# Patient Record
Sex: Female | Born: 1935 | Race: White | Hispanic: No | Marital: Married
Health system: Southern US, Community
[De-identification: ages and names within clinical notes are randomized; demographics above are authoritative.]

## PROBLEM LIST (undated history)

## (undated) DIAGNOSIS — E785 Hyperlipidemia, unspecified: Secondary | ICD-10-CM

## (undated) DIAGNOSIS — I10 Essential (primary) hypertension: Secondary | ICD-10-CM

## (undated) HISTORY — PX: BREAST BIOPSY: SHX20

## (undated) HISTORY — DX: Essential (primary) hypertension: I10

## (undated) HISTORY — DX: Hyperlipidemia, unspecified: E78.5

---

## 1998-10-17 ENCOUNTER — Other Ambulatory Visit: Admission: RE | Admit: 1998-10-17 | Discharge: 1998-10-17 | Payer: Self-pay | Admitting: *Deleted

## 1998-12-09 ENCOUNTER — Ambulatory Visit (HOSPITAL_BASED_OUTPATIENT_CLINIC_OR_DEPARTMENT_OTHER): Admission: RE | Admit: 1998-12-09 | Discharge: 1998-12-09 | Payer: Self-pay | Admitting: *Deleted

## 2000-11-28 ENCOUNTER — Other Ambulatory Visit: Admission: RE | Admit: 2000-11-28 | Discharge: 2000-11-28 | Payer: Self-pay | Admitting: *Deleted

## 2002-01-12 ENCOUNTER — Ambulatory Visit (HOSPITAL_COMMUNITY): Admission: RE | Admit: 2002-01-12 | Discharge: 2002-01-12 | Payer: Self-pay | Admitting: Gastroenterology

## 2003-08-01 ENCOUNTER — Emergency Department (HOSPITAL_COMMUNITY): Admission: EM | Admit: 2003-08-01 | Discharge: 2003-08-01 | Payer: Self-pay

## 2003-12-28 ENCOUNTER — Other Ambulatory Visit: Admission: RE | Admit: 2003-12-28 | Discharge: 2003-12-28 | Payer: Self-pay | Admitting: *Deleted

## 2006-07-24 ENCOUNTER — Other Ambulatory Visit: Admission: RE | Admit: 2006-07-24 | Discharge: 2006-07-24 | Payer: Self-pay | Admitting: *Deleted

## 2014-08-09 ENCOUNTER — Encounter: Payer: Medicare Other | Attending: Family Medicine | Admitting: Skilled Nursing Facility1

## 2014-08-09 ENCOUNTER — Encounter: Payer: Self-pay | Admitting: Skilled Nursing Facility1

## 2014-08-09 VITALS — Ht 65.0 in | Wt 178.0 lb

## 2014-08-09 DIAGNOSIS — Z713 Dietary counseling and surveillance: Secondary | ICD-10-CM | POA: Diagnosis not present

## 2014-08-09 DIAGNOSIS — E119 Type 2 diabetes mellitus without complications: Secondary | ICD-10-CM

## 2014-08-09 NOTE — Progress Notes (Signed)
Diabetes Self-Management Education  Visit Type:    Appt. Start Time: 10:00Appt. End Time: 11:30 08/09/2014  Ms. Kathryn Dominguez, identified by name and date of birth, is a 81 y.o. female with a diagnosis of Diabetes: Type 2.  Other people present during visit:  Family Member   ASSESSMENT  Height 5\' 5"  (1.651 m), weight 178 lb (80.74 kg). Body mass index is 29.62 kg/(m^2).   Initial Visit Information:  Are you currently following a meal plan?: No   Are you taking your medications as prescribed?: Not on Medications Are you checking your feet?: No        Psychosocial:     Patient Belief/Attitude about Diabetes: Denial Self-management support: Family Other persons present: Family Member Patient Concerns: Nutrition/Meal planning, Weight Control, Glycemic Control Preferred Learning Style: Auditory Learning Readiness: Not Ready  Complications:   Last HgB A1C per patient/outside source: 6.5 mg/dL How often do you check your blood sugar?: 0 times/day (not testing) Number of hypoglycemic episodes per month: 0 Number of hyperglycemic episodes per week: 0 Have you had a dilated eye exam in the past 12 months?: Yes Have you had a dental exam in the past 12 months?: Yes  Diet Intake:  Breakfast: oatmeal, toast Snack (morning): orange juice, cracker Lunch: half a sandwhich and crackers and pimento cheese Snack (afternoon): fruit Dinner: soup------pintos and mac n cheese cabbage and corn bread Snack (evening): fruit or crackers---popcorn Beverage(s): coffee, diet soda, water  Exercise:  Exercise: ADL's  Individualized Plan for Diabetes Self-Management Training:   Learning Objective:  Patient will have a greater understanding of diabetes self-management.  Patient education plan per assessed needs and concerns is to attend individual sessions for     Education Topics Reviewed with Patient Today:  Definition of diabetes, type 1 and 2, and the diagnosis of diabetes,  Factors that contribute to the development of diabetes Role of diet in the treatment of diabetes and the relationship between the three main macronutrients and blood glucose level, Food label reading, portion sizes and measuring food., Carbohydrate counting Role of exercise on diabetes management, blood pressure control and cardiac health., Helped patient identify appropriate exercises in relation to his/her diabetes, diabetes complications and other health issue.   Yearly dilated eye exam, Daily foot exams   Assessed and discussed foot care and prevention of foot problems        PATIENTS GOALS/Plan (Developed by the patient):  Physical Activity: Exercise 5-7 days per week  Plan:   There are no Patient Instructions on file for this visit.  Expected Outcomes:  Demonstrated interest in learning. Expect positive outcomes  Education material provided: Living Well with Diabetes, Meal plan card and Snack sheet  If problems or questions, patient to contact team via:  Phone  Future DSME appointment: PRN

## 2018-03-12 ENCOUNTER — Other Ambulatory Visit (HOSPITAL_BASED_OUTPATIENT_CLINIC_OR_DEPARTMENT_OTHER): Payer: Self-pay | Admitting: Family Medicine

## 2018-03-12 DIAGNOSIS — Z1231 Encounter for screening mammogram for malignant neoplasm of breast: Secondary | ICD-10-CM

## 2018-03-31 ENCOUNTER — Encounter (HOSPITAL_BASED_OUTPATIENT_CLINIC_OR_DEPARTMENT_OTHER): Payer: Self-pay | Admitting: Radiology

## 2018-03-31 ENCOUNTER — Ambulatory Visit (HOSPITAL_BASED_OUTPATIENT_CLINIC_OR_DEPARTMENT_OTHER)
Admission: RE | Admit: 2018-03-31 | Discharge: 2018-03-31 | Disposition: A | Discharge status: Home / Self Care | Payer: Medicare Other | Source: Ambulatory Visit | Attending: Family Medicine | Admitting: Family Medicine

## 2018-03-31 DIAGNOSIS — Z1231 Encounter for screening mammogram for malignant neoplasm of breast: Secondary | ICD-10-CM | POA: Insufficient documentation

## 2019-03-19 ENCOUNTER — Other Ambulatory Visit: Payer: Self-pay | Admitting: Family Medicine

## 2019-03-19 DIAGNOSIS — M8580 Other specified disorders of bone density and structure, unspecified site: Secondary | ICD-10-CM

## 2019-05-08 ENCOUNTER — Other Ambulatory Visit (HOSPITAL_BASED_OUTPATIENT_CLINIC_OR_DEPARTMENT_OTHER): Payer: Self-pay | Admitting: Family Medicine

## 2019-05-08 DIAGNOSIS — Z1231 Encounter for screening mammogram for malignant neoplasm of breast: Secondary | ICD-10-CM

## 2019-05-08 DIAGNOSIS — M8580 Other specified disorders of bone density and structure, unspecified site: Secondary | ICD-10-CM

## 2019-05-18 ENCOUNTER — Other Ambulatory Visit: Payer: Self-pay

## 2019-05-18 ENCOUNTER — Ambulatory Visit (HOSPITAL_BASED_OUTPATIENT_CLINIC_OR_DEPARTMENT_OTHER)
Admission: RE | Admit: 2019-05-18 | Discharge: 2019-05-18 | Disposition: A | Discharge status: Home / Self Care | Payer: Medicare Other | Source: Ambulatory Visit | Attending: Family Medicine | Admitting: Family Medicine

## 2019-05-18 ENCOUNTER — Encounter (HOSPITAL_BASED_OUTPATIENT_CLINIC_OR_DEPARTMENT_OTHER): Payer: Self-pay

## 2019-05-18 DIAGNOSIS — Z1231 Encounter for screening mammogram for malignant neoplasm of breast: Secondary | ICD-10-CM | POA: Insufficient documentation

## 2019-05-18 DIAGNOSIS — M85851 Other specified disorders of bone density and structure, right thigh: Secondary | ICD-10-CM | POA: Diagnosis not present

## 2019-05-18 DIAGNOSIS — M8580 Other specified disorders of bone density and structure, unspecified site: Secondary | ICD-10-CM | POA: Diagnosis present

## 2019-05-19 ENCOUNTER — Other Ambulatory Visit: Payer: Self-pay | Admitting: Family Medicine

## 2019-05-19 DIAGNOSIS — R928 Other abnormal and inconclusive findings on diagnostic imaging of breast: Secondary | ICD-10-CM

## 2019-06-23 ENCOUNTER — Other Ambulatory Visit: Payer: Self-pay

## 2019-06-23 ENCOUNTER — Other Ambulatory Visit: Payer: Self-pay | Admitting: Family Medicine

## 2019-06-23 ENCOUNTER — Ambulatory Visit
Admission: RE | Admit: 2019-06-23 | Discharge: 2019-06-23 | Disposition: A | Discharge status: Home / Self Care | Payer: Medicare Other | Source: Ambulatory Visit | Attending: Family Medicine | Admitting: Family Medicine

## 2019-06-23 DIAGNOSIS — R928 Other abnormal and inconclusive findings on diagnostic imaging of breast: Secondary | ICD-10-CM

## 2019-06-23 DIAGNOSIS — N6310 Unspecified lump in the right breast, unspecified quadrant: Secondary | ICD-10-CM

## 2019-07-01 ENCOUNTER — Ambulatory Visit
Admission: RE | Admit: 2019-07-01 | Discharge: 2019-07-01 | Disposition: A | Discharge status: Home / Self Care | Payer: Medicare Other | Source: Ambulatory Visit | Attending: Family Medicine | Admitting: Family Medicine

## 2019-07-01 ENCOUNTER — Other Ambulatory Visit: Payer: Self-pay

## 2019-07-01 DIAGNOSIS — N6310 Unspecified lump in the right breast, unspecified quadrant: Secondary | ICD-10-CM

## 2020-04-26 DIAGNOSIS — D0439 Carcinoma in situ of skin of other parts of face: Secondary | ICD-10-CM | POA: Diagnosis not present

## 2020-04-26 DIAGNOSIS — Z86008 Personal history of in-situ neoplasm of other site: Secondary | ICD-10-CM | POA: Diagnosis not present

## 2020-04-26 DIAGNOSIS — L570 Actinic keratosis: Secondary | ICD-10-CM | POA: Diagnosis not present

## 2020-04-26 DIAGNOSIS — D0462 Carcinoma in situ of skin of left upper limb, including shoulder: Secondary | ICD-10-CM | POA: Diagnosis not present

## 2020-04-26 DIAGNOSIS — D0461 Carcinoma in situ of skin of right upper limb, including shoulder: Secondary | ICD-10-CM | POA: Diagnosis not present

## 2020-05-10 DIAGNOSIS — E782 Mixed hyperlipidemia: Secondary | ICD-10-CM | POA: Diagnosis not present

## 2020-05-10 DIAGNOSIS — E1140 Type 2 diabetes mellitus with diabetic neuropathy, unspecified: Secondary | ICD-10-CM | POA: Diagnosis not present

## 2020-05-10 DIAGNOSIS — E1169 Type 2 diabetes mellitus with other specified complication: Secondary | ICD-10-CM | POA: Diagnosis not present

## 2020-05-10 DIAGNOSIS — I10 Essential (primary) hypertension: Secondary | ICD-10-CM | POA: Diagnosis not present

## 2020-05-10 DIAGNOSIS — M8580 Other specified disorders of bone density and structure, unspecified site: Secondary | ICD-10-CM | POA: Diagnosis not present

## 2020-05-19 DIAGNOSIS — E1169 Type 2 diabetes mellitus with other specified complication: Secondary | ICD-10-CM | POA: Diagnosis not present

## 2020-05-19 DIAGNOSIS — Z0000 Encounter for general adult medical examination without abnormal findings: Secondary | ICD-10-CM | POA: Diagnosis not present

## 2020-05-19 DIAGNOSIS — I10 Essential (primary) hypertension: Secondary | ICD-10-CM | POA: Diagnosis not present

## 2020-05-26 DIAGNOSIS — E1169 Type 2 diabetes mellitus with other specified complication: Secondary | ICD-10-CM | POA: Diagnosis not present

## 2020-05-26 DIAGNOSIS — R609 Edema, unspecified: Secondary | ICD-10-CM | POA: Diagnosis not present

## 2020-05-26 DIAGNOSIS — Z0000 Encounter for general adult medical examination without abnormal findings: Secondary | ICD-10-CM | POA: Diagnosis not present

## 2020-05-26 DIAGNOSIS — I10 Essential (primary) hypertension: Secondary | ICD-10-CM | POA: Diagnosis not present

## 2020-05-26 DIAGNOSIS — E782 Mixed hyperlipidemia: Secondary | ICD-10-CM | POA: Diagnosis not present

## 2020-05-26 DIAGNOSIS — E1140 Type 2 diabetes mellitus with diabetic neuropathy, unspecified: Secondary | ICD-10-CM | POA: Diagnosis not present

## 2020-06-03 DIAGNOSIS — Z1231 Encounter for screening mammogram for malignant neoplasm of breast: Secondary | ICD-10-CM | POA: Diagnosis not present

## 2020-06-03 DIAGNOSIS — Z171 Estrogen receptor negative status [ER-]: Secondary | ICD-10-CM | POA: Diagnosis not present

## 2020-06-03 DIAGNOSIS — C50411 Malignant neoplasm of upper-outer quadrant of right female breast: Secondary | ICD-10-CM | POA: Diagnosis not present

## 2020-06-13 DIAGNOSIS — Z961 Presence of intraocular lens: Secondary | ICD-10-CM | POA: Diagnosis not present

## 2020-06-13 DIAGNOSIS — E113291 Type 2 diabetes mellitus with mild nonproliferative diabetic retinopathy without macular edema, right eye: Secondary | ICD-10-CM | POA: Diagnosis not present

## 2020-06-13 DIAGNOSIS — H524 Presbyopia: Secondary | ICD-10-CM | POA: Diagnosis not present

## 2020-06-27 DIAGNOSIS — I739 Peripheral vascular disease, unspecified: Secondary | ICD-10-CM | POA: Diagnosis not present

## 2020-06-27 DIAGNOSIS — B351 Tinea unguium: Secondary | ICD-10-CM | POA: Diagnosis not present

## 2020-06-27 DIAGNOSIS — L851 Acquired keratosis [keratoderma] palmaris et plantaris: Secondary | ICD-10-CM | POA: Diagnosis not present

## 2020-12-01 ENCOUNTER — Other Ambulatory Visit: Payer: Self-pay

## 2020-12-01 ENCOUNTER — Emergency Department (HOSPITAL_BASED_OUTPATIENT_CLINIC_OR_DEPARTMENT_OTHER)
Admission: EM | Admit: 2020-12-01 | Discharge: 2020-12-01 | Disposition: A | Discharge status: Home / Self Care | Payer: Medicare Other | Attending: Emergency Medicine | Admitting: Emergency Medicine

## 2020-12-01 ENCOUNTER — Emergency Department (HOSPITAL_BASED_OUTPATIENT_CLINIC_OR_DEPARTMENT_OTHER): Payer: Medicare Other

## 2020-12-01 ENCOUNTER — Encounter (HOSPITAL_BASED_OUTPATIENT_CLINIC_OR_DEPARTMENT_OTHER): Payer: Self-pay

## 2020-12-01 DIAGNOSIS — Y92002 Bathroom of unspecified non-institutional (private) residence single-family (private) house as the place of occurrence of the external cause: Secondary | ICD-10-CM | POA: Diagnosis not present

## 2020-12-01 DIAGNOSIS — S46912A Strain of unspecified muscle, fascia and tendon at shoulder and upper arm level, left arm, initial encounter: Secondary | ICD-10-CM | POA: Insufficient documentation

## 2020-12-01 DIAGNOSIS — Z79899 Other long term (current) drug therapy: Secondary | ICD-10-CM | POA: Diagnosis not present

## 2020-12-01 DIAGNOSIS — I10 Essential (primary) hypertension: Secondary | ICD-10-CM | POA: Diagnosis not present

## 2020-12-01 DIAGNOSIS — S8001XA Contusion of right knee, initial encounter: Secondary | ICD-10-CM | POA: Diagnosis not present

## 2020-12-01 DIAGNOSIS — S4992XA Unspecified injury of left shoulder and upper arm, initial encounter: Secondary | ICD-10-CM | POA: Diagnosis present

## 2020-12-01 DIAGNOSIS — W010XXA Fall on same level from slipping, tripping and stumbling without subsequent striking against object, initial encounter: Secondary | ICD-10-CM | POA: Diagnosis not present

## 2020-12-01 DIAGNOSIS — Y9301 Activity, walking, marching and hiking: Secondary | ICD-10-CM | POA: Insufficient documentation

## 2020-12-01 DIAGNOSIS — S53402A Unspecified sprain of left elbow, initial encounter: Secondary | ICD-10-CM | POA: Diagnosis not present

## 2020-12-01 DIAGNOSIS — S8002XA Contusion of left knee, initial encounter: Secondary | ICD-10-CM | POA: Diagnosis not present

## 2020-12-01 DIAGNOSIS — W19XXXA Unspecified fall, initial encounter: Secondary | ICD-10-CM

## 2020-12-01 MED ORDER — ACETAMINOPHEN 325 MG PO TABS
650.0000 mg | ORAL_TABLET | Freq: Once | ORAL | Status: AC
  Administered 2020-12-01: 650 mg via ORAL
  Filled 2020-12-01: qty 2

## 2020-12-01 NOTE — ED Provider Notes (Signed)
[place] EMERGENCY DEPARTMENT Provider Note   CSN: [id number] Arrival date & time: 12/01/20  0836     History Chief Complaint  Patient presents with   Kathryn Dominguez is an 87 y.o. female who presents s/p fall at home.  Patient was rushing to the bathroom this morning at 2am when she tripped on her own feet and fell. Denies syncope or LOC. No preceding dizziness/lightheadedness. She was able to scoot herself to her room and call her daughter. Has ambulated since the incident.  Thinks she hit her head against the refrigerator slightly. Denies headache. Not on any blood thinners.  Currently complains of left shoulder, left elbow, and bilateral knee pain. No headache, nausea, vomiting, back pain, neck pain.   Past Medical History:  Diagnosis Date   Hyperlipidemia    Hypertension     There are no problems to display for this patient.   Past Surgical History:  Procedure Laterality Date   BREAST BIOPSY       OB History   No obstetric history on file.     Family History  Problem Relation Age of Onset   Stroke Other        Home Medications Prior to Admission medications   Medication Sig Start Date End Date Taking? Authorizing Provider  calcium carbonate (OS-CAL) 600 MG TABS tablet Take 600 mg by mouth 2 (two) times daily with a meal.    [provider]  cholecalciferol (VITAMIN D) 1000 UNITS tablet Take 2,000 Units by mouth daily.    [provider]  metoprolol succinate (TOPROL-XL) 100 MG 24 hr tablet Take 100 mg by mouth daily. Take with or immediately following a meal.    [provider]  Omega-3 Fatty Acids (FISH OIL) 1200 MG CAPS Take 1,290 mg by mouth.    [provider]  Red Yeast Rice 600 MG CAPS Take by mouth.    [provider]  triamterene-hydrochlorothiazide (DYAZIDE) 37.5-25 MG per capsule Take 1 capsule by mouth daily.    [provider]    Allergies    Lovastatin  Review of  Systems   Review of Systems  Gastrointestinal:  Negative for vomiting.  Musculoskeletal:  Negative for back pain, gait problem and neck pain.       L shoulder pain, L elbow pain, bilateral knee pain  Neurological:  Negative for syncope, light-headedness and headaches.   Physical Exam Updated Vital Signs BP 129/70   Pulse (!) 56   Temp 97.7 F (36.5 C) (Oral)   Resp 18   Ht '5\' 5"'$  (1.651 m)   Wt 75.3 kg   SpO2 98%   BMI 27.62 kg/m   Physical Exam Constitutional:      General: She is not in acute distress.    Appearance: Normal appearance.  HENT:     Head: Normocephalic and atraumatic.  Abdominal:     Palpations: Abdomen is soft.     Tenderness: There is no abdominal tenderness.  Musculoskeletal:     Cervical back: Normal range of motion. No tenderness.     Comments: Clavicles nontender. No deformities noted. L shoulder tender to palpation over [employer] joint, pain with active ROM. L elbow tender to palpation with mild bruising noted, full ROM. 5/5 grip strength. NVI distally. Bilateral knees mildly tender to palpation of patella, slight bruising noted, full ROM, able to bear weight  Neurological:     General: No focal deficit present.     Mental  Status: She is alert.     Motor: No weakness.    ED Results / Procedures / Treatments   Labs (all labs ordered are listed, but only abnormal results are displayed) Labs Reviewed - No data to display  EKG None  Radiology DG Elbow Complete Left  Result Date: 12/01/2020 CLINICAL DATA:  Fall. EXAM: LEFT ELBOW - COMPLETE 3+ VIEW COMPARISON:  None. FINDINGS: No evidence of fracture of the ulna or humerus. The radial head is normal. No joint effusion. IMPRESSION: No fracture or dislocation. Electronically Signed   By: Suzy Bouchard M.D.   On: 12/01/2020 10:40   DG Shoulder Left  Result Date: 12/01/2020 CLINICAL DATA:  Fall EXAM: LEFT SHOULDER - 2+ VIEW COMPARISON:  None. FINDINGS: There is no acute fracture or dislocation. Shoulder  alignment is maintained. There is mild glenohumeral joint space narrowing with associated subchondral sclerosis and osteophytosis. The soft tissues are unremarkable. IMPRESSION: No acute fracture or dislocation. Mild degenerative changes as above. Electronically Signed   By: Valetta Mole M.D.   On: 12/01/2020 10:36   DG Knee Complete 4 Views Left  Result Date: 12/01/2020 CLINICAL DATA:  Left knee pain after fall today. EXAM: LEFT KNEE - COMPLETE 4+ VIEW COMPARISON:  None. FINDINGS: No evidence of fracture, dislocation, or joint effusion. No evidence of arthropathy or other focal bone abnormality. Soft tissues are unremarkable. IMPRESSION: Negative. Electronically Signed   By: Marijo Conception M.D.   On: 12/01/2020 10:37   DG Knee Complete 4 Views Right  Result Date: 12/01/2020 CLINICAL DATA:  Right knee pain after fall today. EXAM: RIGHT KNEE - COMPLETE 4+ VIEW COMPARISON:  None. FINDINGS: No evidence of fracture, dislocation, or joint effusion. No evidence of arthropathy or other focal bone abnormality. Soft tissues are unremarkable. IMPRESSION: Negative. Electronically Signed   By: Marijo Conception M.D.   On: 12/01/2020 10:39    Procedures Procedures   Medications Ordered in ED Medications  acetaminophen (TYLENOL) tablet 650 mg (650 mg Oral Given 12/01/20 0959)    ED Course  I have reviewed the triage vital signs and the nursing notes.  Pertinent labs & imaging results that were available during my care of the patient were reviewed by me and considered in my medical decision making (see chart for details).    MDM Rules/Calculators/A&P                         This is an 86 year old female presenting after mechanical fall at home. No significant head injury, patient is not on blood thinners, neuro exam is normal so no indication for head imaging at this time.  She complains of L shoulder, L elbow and bilateral knee pain. No deformities noted on exam but she has tenderness to palpation over  these joints and some pain with ROM. Will obtain plain films and give Tylenol for pain management.  X-rays of shoulder, elbow, and knees negative for fracture or other abnormality. Patient informed of findings. Advised to take Tylenol as needed for pain.  Stable for discharge home. Return precautions discussed. Final Clinical Impression(s) / ED Diagnoses Final diagnoses:  Fall, initial encounter  Strain of left shoulder, initial encounter  Strain of left elbow, initial encounter    Rx / DC Orders ED Discharge Orders     None      Alcus Dad, MD PGY-2 [facility]   Alcus Dad, MD 12/01/20 1124    Lucrezia Starch, MD 12/01/20  1324  

## 2020-12-01 NOTE — Discharge Instructions (Addendum)
Take Tylenol or Motrin for pain.  Come back to ER if you develop worsening pain, difficulty walking further falls or other new concerning symptoms.

## 2020-12-01 NOTE — ED Triage Notes (Signed)
Pt got up this morning around 0200 to go to bathroom and was walking to fast and her "feet got out from under me". Pt fell into the fridge and then fell to the floor. Pt c/o left shoulder, elbow and bilateral knee pain. Denies any LOC, denies hitting her head.

## 2021-02-20 IMAGING — MG MM DIGITAL DIAGNOSTIC UNILAT*R* W/ TOMO W/ CAD
7 series · 9 of 15 positions shown · non-contrast
Comparison: Previous exam(s).

CLINICAL DATA: Patient was called back from screening mammography
due to a right breast mass.

EXAM:
DIGITAL DIAGNOSTIC RIGHT MAMMOGRAM WITH TOMO
ULTRASOUND RIGHT BREAST

[R ML]
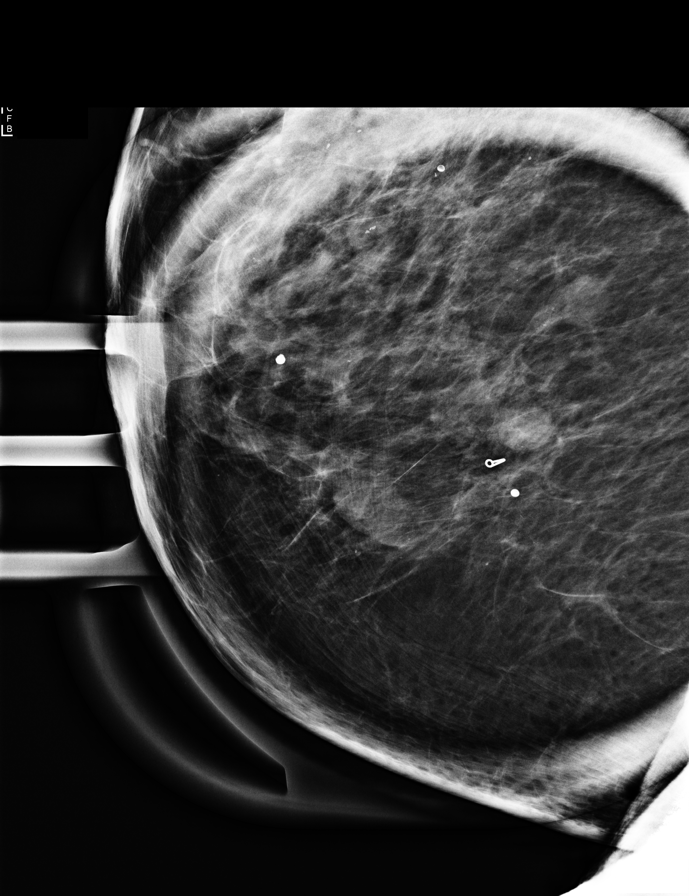

[R CC (1 of 2)]
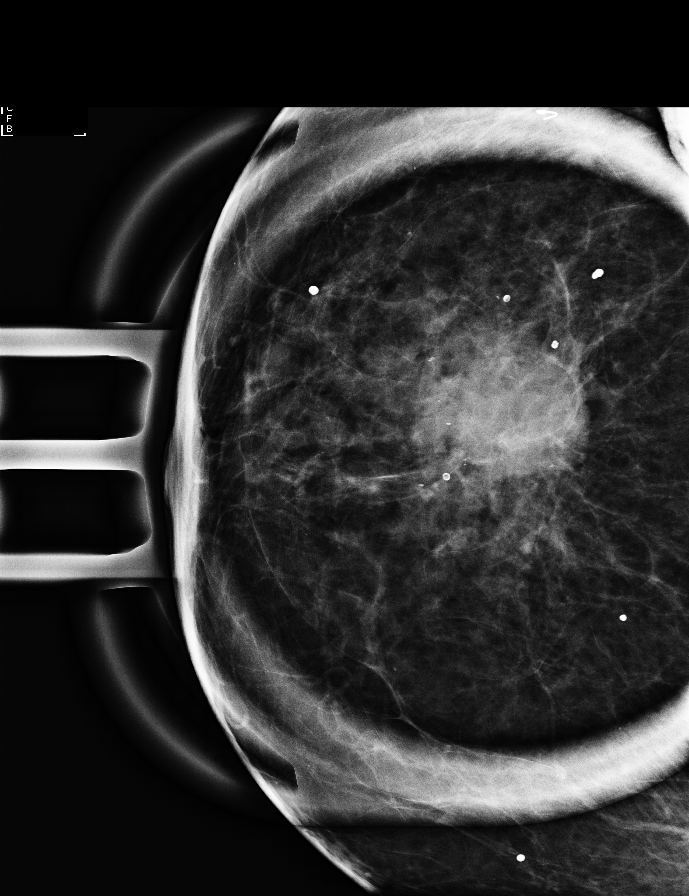

[R CC (2 of 2)]
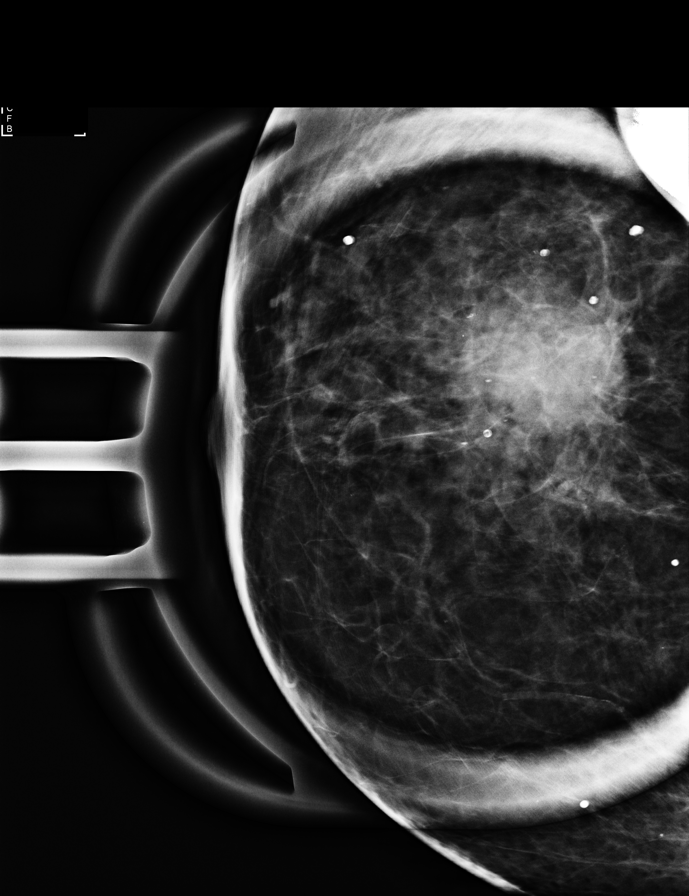

[R MLO synth-2D]
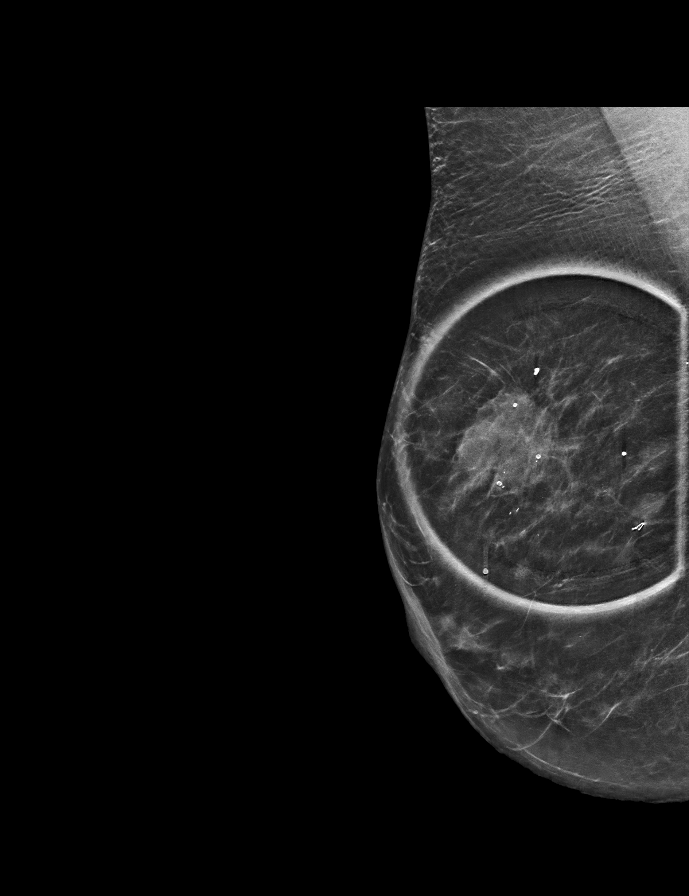

[R CC synth-2D]
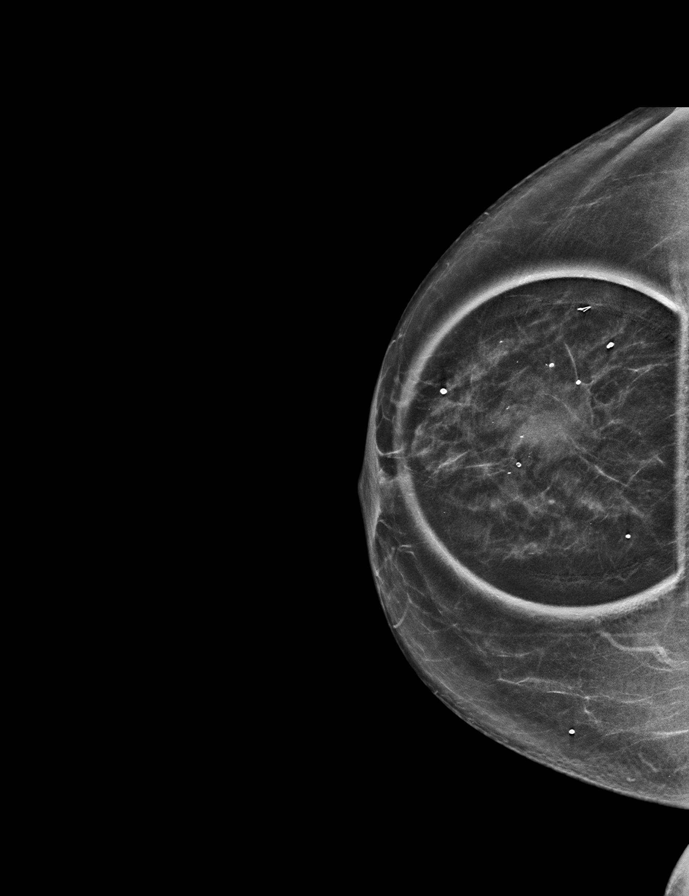

[R MLO tomo · 3 of 59 frames shown]
[frame 20/59]
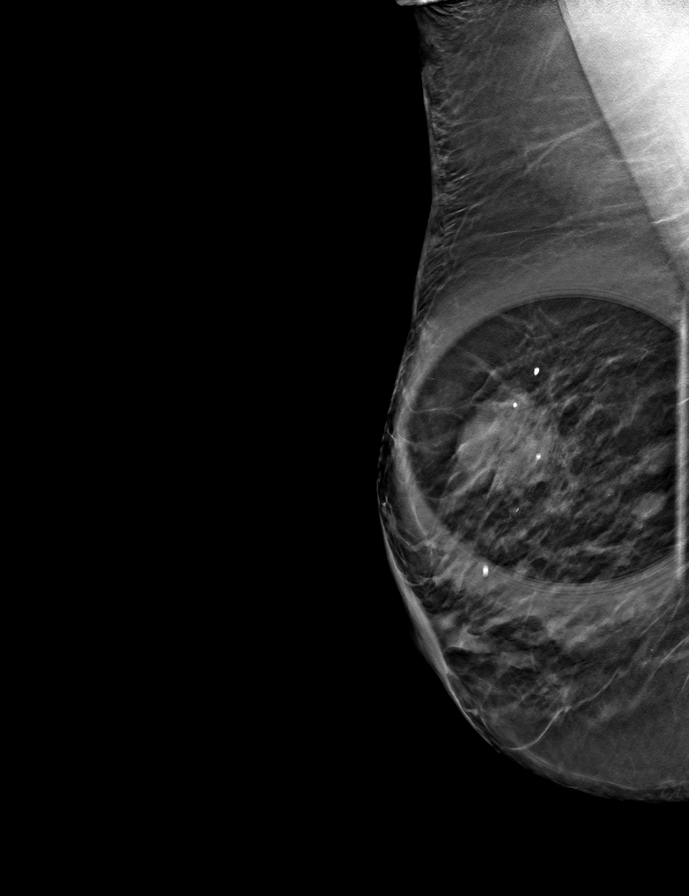
[frame 30/59]
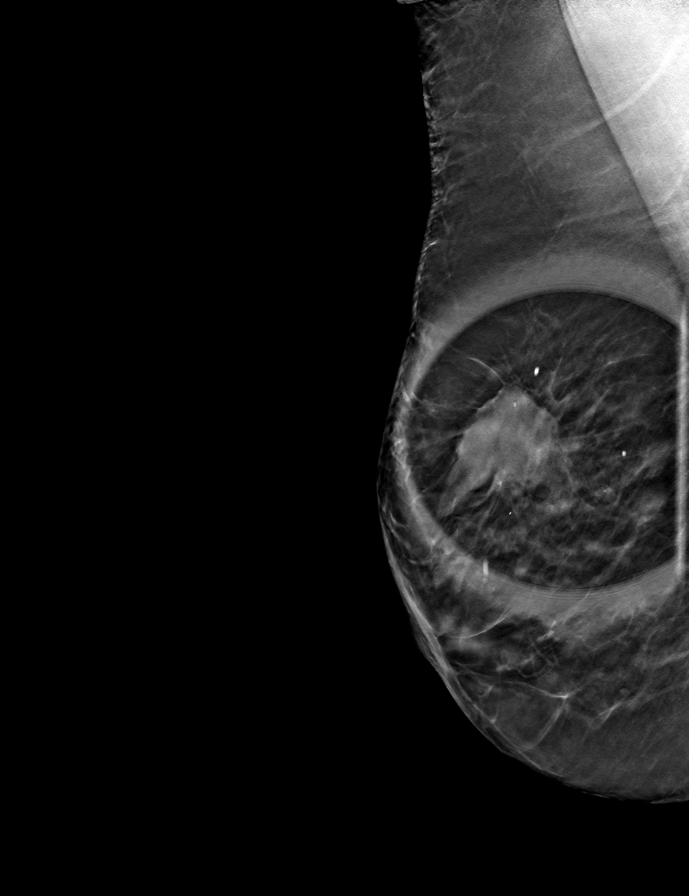
[frame 40/59]
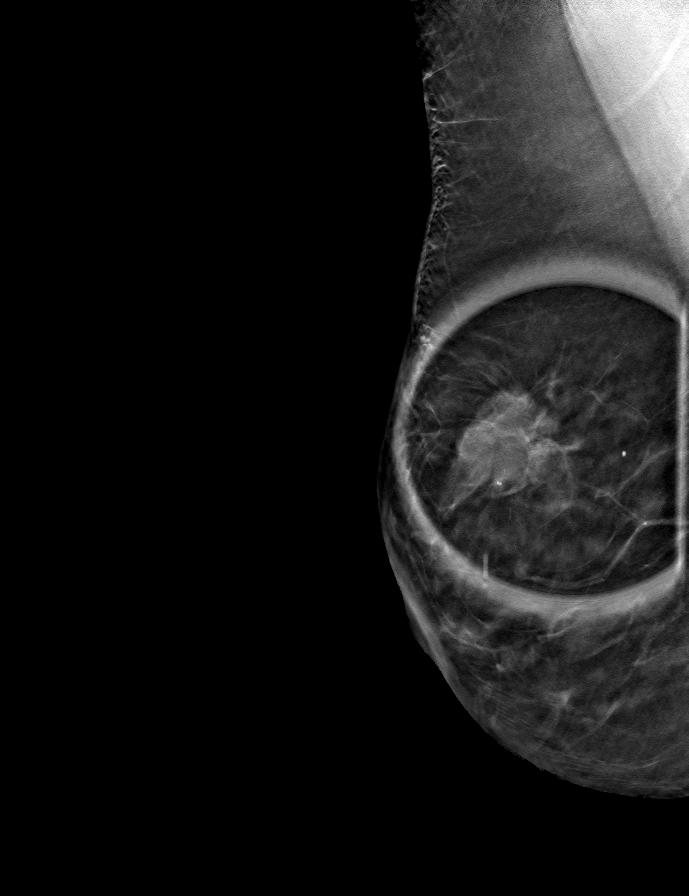

[R CC tomo · tomo slice 29/56.0]
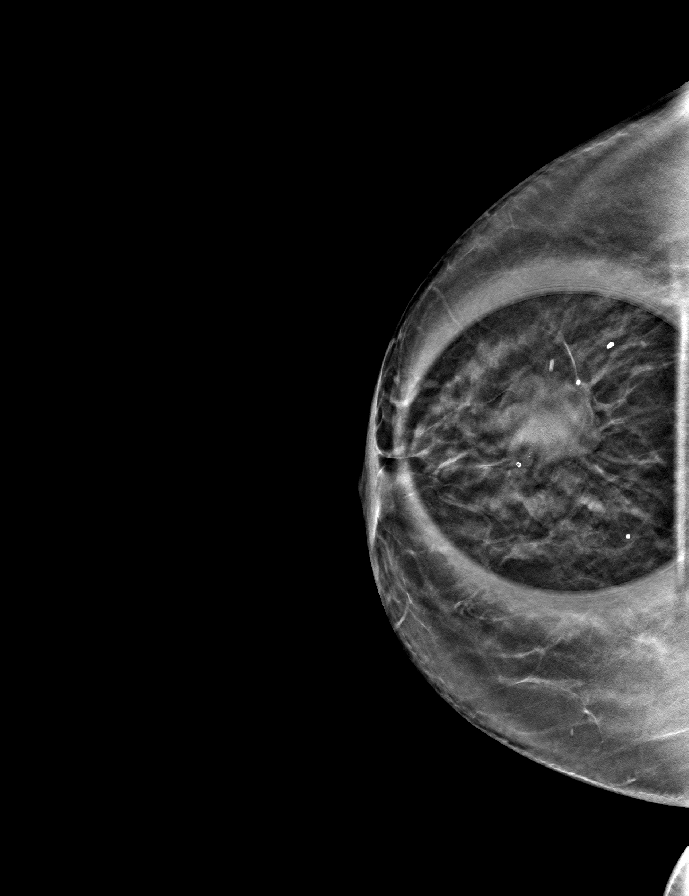

[9 of 15 positions shown; findings below may reference images not displayed]

ACR Breast Density Category b: There are scattered areas of
fibroglandular density.
FINDINGS: There is a mass at [DATE] in the right breast containing few
calcifications. No other suspicious findings.

On physical exam, there is a palpable lump in the right breast
correlating with the mammographically identified mass.

Targeted ultrasound is performed, showing a mass in the right breast
at [DATE], 4 cm from the nipple containing several calcifications
measuring 2.8 x 2.3 by 2.8 cm. The mass is irregular with internal
blood flow. No axillary adenopathy.
IMPRESSION: Highly suspicious right breast mass.  No other suspicious findings.

RECOMMENDATION:
Recommend ultrasound-guided biopsy of the right breast mass at
[DATE], 4 cm from the nipple.

I have discussed the findings and recommendations with the patient.
If applicable, a reminder letter will be sent to the patient
regarding the next appointment.

BI-RADS CATEGORY  5: Highly suggestive of malignancy.

## 2021-12-07 DIAGNOSIS — L851 Acquired keratosis [keratoderma] palmaris et plantaris: Secondary | ICD-10-CM | POA: Diagnosis not present

## 2021-12-07 DIAGNOSIS — I739 Peripheral vascular disease, unspecified: Secondary | ICD-10-CM | POA: Diagnosis not present

## 2021-12-07 DIAGNOSIS — B351 Tinea unguium: Secondary | ICD-10-CM | POA: Diagnosis not present

## 2021-12-13 DIAGNOSIS — L03116 Cellulitis of left lower limb: Secondary | ICD-10-CM | POA: Diagnosis not present

## 2021-12-26 DIAGNOSIS — E1169 Type 2 diabetes mellitus with other specified complication: Secondary | ICD-10-CM | POA: Diagnosis not present

## 2021-12-26 DIAGNOSIS — I10 Essential (primary) hypertension: Secondary | ICD-10-CM | POA: Diagnosis not present

## 2021-12-26 DIAGNOSIS — E782 Mixed hyperlipidemia: Secondary | ICD-10-CM | POA: Diagnosis not present

## 2022-01-01 DIAGNOSIS — R2242 Localized swelling, mass and lump, left lower limb: Secondary | ICD-10-CM | POA: Diagnosis not present

## 2022-01-01 DIAGNOSIS — Z23 Encounter for immunization: Secondary | ICD-10-CM | POA: Diagnosis not present

## 2022-01-02 DIAGNOSIS — R2242 Localized swelling, mass and lump, left lower limb: Secondary | ICD-10-CM | POA: Diagnosis not present

## 2022-01-03 ENCOUNTER — Other Ambulatory Visit (HOSPITAL_BASED_OUTPATIENT_CLINIC_OR_DEPARTMENT_OTHER): Payer: Self-pay | Admitting: Physician Assistant

## 2022-01-04 ENCOUNTER — Other Ambulatory Visit (HOSPITAL_BASED_OUTPATIENT_CLINIC_OR_DEPARTMENT_OTHER): Payer: Self-pay | Admitting: Physician Assistant

## 2022-01-04 DIAGNOSIS — R2242 Localized swelling, mass and lump, left lower limb: Secondary | ICD-10-CM

## 2022-01-30 DIAGNOSIS — E1169 Type 2 diabetes mellitus with other specified complication: Secondary | ICD-10-CM | POA: Diagnosis not present

## 2022-01-30 DIAGNOSIS — I10 Essential (primary) hypertension: Secondary | ICD-10-CM | POA: Diagnosis not present

## 2022-01-30 DIAGNOSIS — E782 Mixed hyperlipidemia: Secondary | ICD-10-CM | POA: Diagnosis not present

## 2022-02-08 DIAGNOSIS — C50411 Malignant neoplasm of upper-outer quadrant of right female breast: Secondary | ICD-10-CM | POA: Diagnosis not present

## 2022-02-08 DIAGNOSIS — Z888 Allergy status to other drugs, medicaments and biological substances status: Secondary | ICD-10-CM | POA: Diagnosis not present

## 2022-02-08 DIAGNOSIS — Z853 Personal history of malignant neoplasm of breast: Secondary | ICD-10-CM | POA: Diagnosis not present

## 2022-02-08 DIAGNOSIS — Z08 Encounter for follow-up examination after completed treatment for malignant neoplasm: Secondary | ICD-10-CM | POA: Diagnosis not present

## 2022-02-08 DIAGNOSIS — Z882 Allergy status to sulfonamides status: Secondary | ICD-10-CM | POA: Diagnosis not present

## 2022-02-08 DIAGNOSIS — Z171 Estrogen receptor negative status [ER-]: Secondary | ICD-10-CM | POA: Diagnosis not present

## 2022-03-06 DIAGNOSIS — D0461 Carcinoma in situ of skin of right upper limb, including shoulder: Secondary | ICD-10-CM | POA: Diagnosis not present

## 2022-03-06 DIAGNOSIS — D044 Carcinoma in situ of skin of scalp and neck: Secondary | ICD-10-CM | POA: Diagnosis not present

## 2022-03-06 DIAGNOSIS — D485 Neoplasm of uncertain behavior of skin: Secondary | ICD-10-CM | POA: Diagnosis not present

## 2022-03-06 DIAGNOSIS — D0472 Carcinoma in situ of skin of left lower limb, including hip: Secondary | ICD-10-CM | POA: Diagnosis not present

## 2022-03-06 DIAGNOSIS — Z859 Personal history of malignant neoplasm, unspecified: Secondary | ICD-10-CM | POA: Diagnosis not present

## 2022-03-06 DIAGNOSIS — Z85828 Personal history of other malignant neoplasm of skin: Secondary | ICD-10-CM | POA: Diagnosis not present

## 2022-03-06 DIAGNOSIS — Z86008 Personal history of in-situ neoplasm of other site: Secondary | ICD-10-CM | POA: Diagnosis not present

## 2022-03-06 DIAGNOSIS — D2339 Other benign neoplasm of skin of other parts of face: Secondary | ICD-10-CM | POA: Diagnosis not present

## 2022-03-08 DIAGNOSIS — I739 Peripheral vascular disease, unspecified: Secondary | ICD-10-CM | POA: Diagnosis not present

## 2022-03-08 DIAGNOSIS — M2041 Other hammer toe(s) (acquired), right foot: Secondary | ICD-10-CM | POA: Diagnosis not present

## 2022-03-08 DIAGNOSIS — L851 Acquired keratosis [keratoderma] palmaris et plantaris: Secondary | ICD-10-CM | POA: Diagnosis not present

## 2022-03-08 DIAGNOSIS — B351 Tinea unguium: Secondary | ICD-10-CM | POA: Diagnosis not present

## 2022-04-18 DIAGNOSIS — I10 Essential (primary) hypertension: Secondary | ICD-10-CM | POA: Diagnosis not present

## 2022-04-18 DIAGNOSIS — E1140 Type 2 diabetes mellitus with diabetic neuropathy, unspecified: Secondary | ICD-10-CM | POA: Diagnosis not present

## 2022-04-18 DIAGNOSIS — E782 Mixed hyperlipidemia: Secondary | ICD-10-CM | POA: Diagnosis not present

## 2022-04-18 DIAGNOSIS — E1169 Type 2 diabetes mellitus with other specified complication: Secondary | ICD-10-CM | POA: Diagnosis not present

## 2022-04-18 DIAGNOSIS — M8580 Other specified disorders of bone density and structure, unspecified site: Secondary | ICD-10-CM | POA: Diagnosis not present

## 2022-06-04 DIAGNOSIS — I739 Peripheral vascular disease, unspecified: Secondary | ICD-10-CM | POA: Diagnosis not present

## 2022-06-04 DIAGNOSIS — L851 Acquired keratosis [keratoderma] palmaris et plantaris: Secondary | ICD-10-CM | POA: Diagnosis not present

## 2022-06-04 DIAGNOSIS — L600 Ingrowing nail: Secondary | ICD-10-CM | POA: Diagnosis not present

## 2022-06-04 DIAGNOSIS — B351 Tinea unguium: Secondary | ICD-10-CM | POA: Diagnosis not present

## 2022-06-04 DIAGNOSIS — M2041 Other hammer toe(s) (acquired), right foot: Secondary | ICD-10-CM | POA: Diagnosis not present

## 2022-06-08 DIAGNOSIS — C50411 Malignant neoplasm of upper-outer quadrant of right female breast: Secondary | ICD-10-CM | POA: Diagnosis not present

## 2022-06-08 DIAGNOSIS — Z1231 Encounter for screening mammogram for malignant neoplasm of breast: Secondary | ICD-10-CM | POA: Diagnosis not present

## 2022-06-08 DIAGNOSIS — Z171 Estrogen receptor negative status [ER-]: Secondary | ICD-10-CM | POA: Diagnosis not present

## 2022-06-13 DIAGNOSIS — Z171 Estrogen receptor negative status [ER-]: Secondary | ICD-10-CM | POA: Diagnosis not present

## 2022-06-13 DIAGNOSIS — C50411 Malignant neoplasm of upper-outer quadrant of right female breast: Secondary | ICD-10-CM | POA: Diagnosis not present

## 2022-06-22 DIAGNOSIS — Z9181 History of falling: Secondary | ICD-10-CM | POA: Diagnosis not present

## 2022-06-22 DIAGNOSIS — I10 Essential (primary) hypertension: Secondary | ICD-10-CM | POA: Diagnosis not present

## 2022-06-22 DIAGNOSIS — E1140 Type 2 diabetes mellitus with diabetic neuropathy, unspecified: Secondary | ICD-10-CM | POA: Diagnosis not present

## 2022-06-22 DIAGNOSIS — E782 Mixed hyperlipidemia: Secondary | ICD-10-CM | POA: Diagnosis not present

## 2022-06-22 DIAGNOSIS — E1165 Type 2 diabetes mellitus with hyperglycemia: Secondary | ICD-10-CM | POA: Diagnosis not present

## 2022-06-22 DIAGNOSIS — Z0000 Encounter for general adult medical examination without abnormal findings: Secondary | ICD-10-CM | POA: Diagnosis not present

## 2022-07-17 DIAGNOSIS — E782 Mixed hyperlipidemia: Secondary | ICD-10-CM | POA: Diagnosis not present

## 2022-07-17 DIAGNOSIS — I10 Essential (primary) hypertension: Secondary | ICD-10-CM | POA: Diagnosis not present

## 2022-07-17 DIAGNOSIS — E1169 Type 2 diabetes mellitus with other specified complication: Secondary | ICD-10-CM | POA: Diagnosis not present

## 2022-07-17 DIAGNOSIS — E1140 Type 2 diabetes mellitus with diabetic neuropathy, unspecified: Secondary | ICD-10-CM | POA: Diagnosis not present

## 2022-08-01 IMAGING — DX DG KNEE COMPLETE 4+V*R*
4 series · 4 of 4 positions shown · non-contrast
Comparison: None.

CLINICAL DATA: Right knee pain after fall today.

EXAM:
RIGHT KNEE - COMPLETE 4+ VIEW

[knee ap]
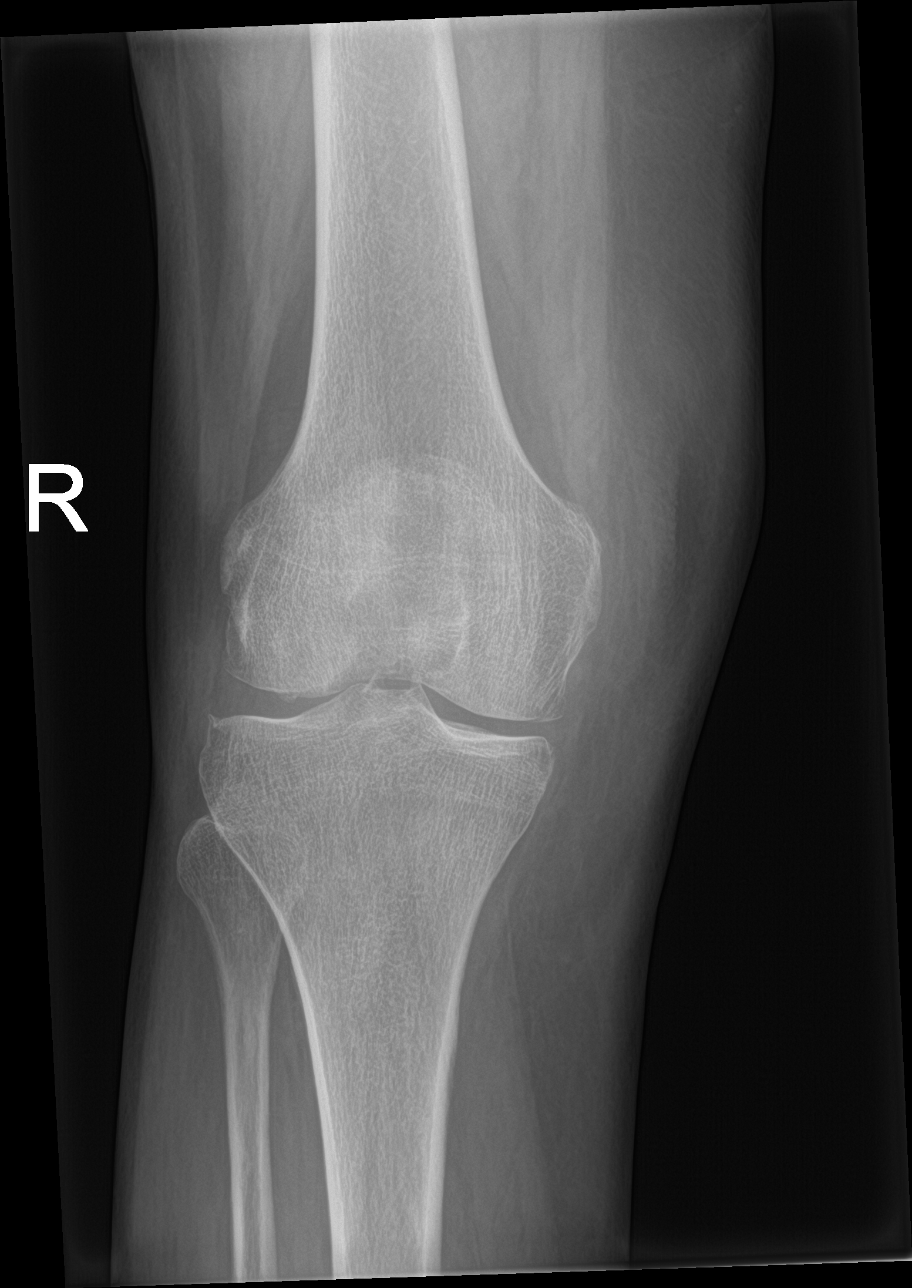

[knee lat]
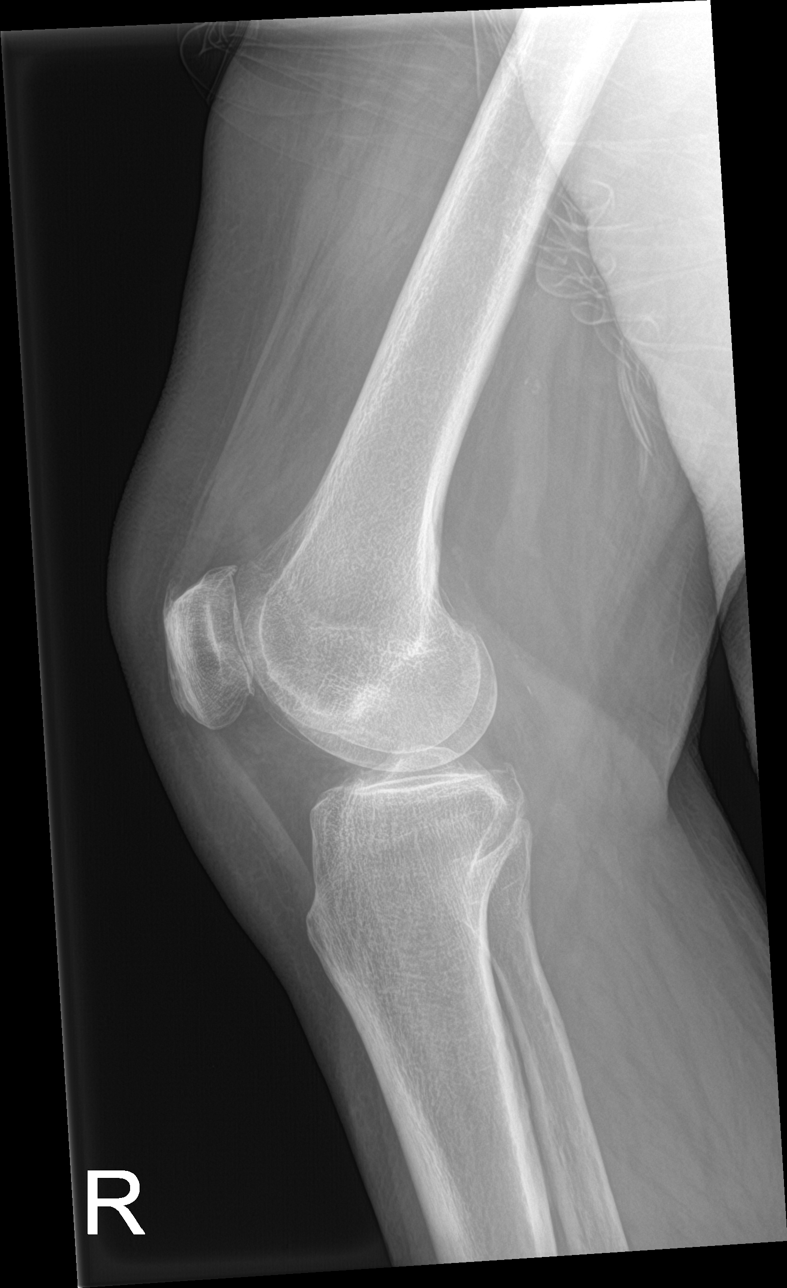

[knee obl (1 of 2)]
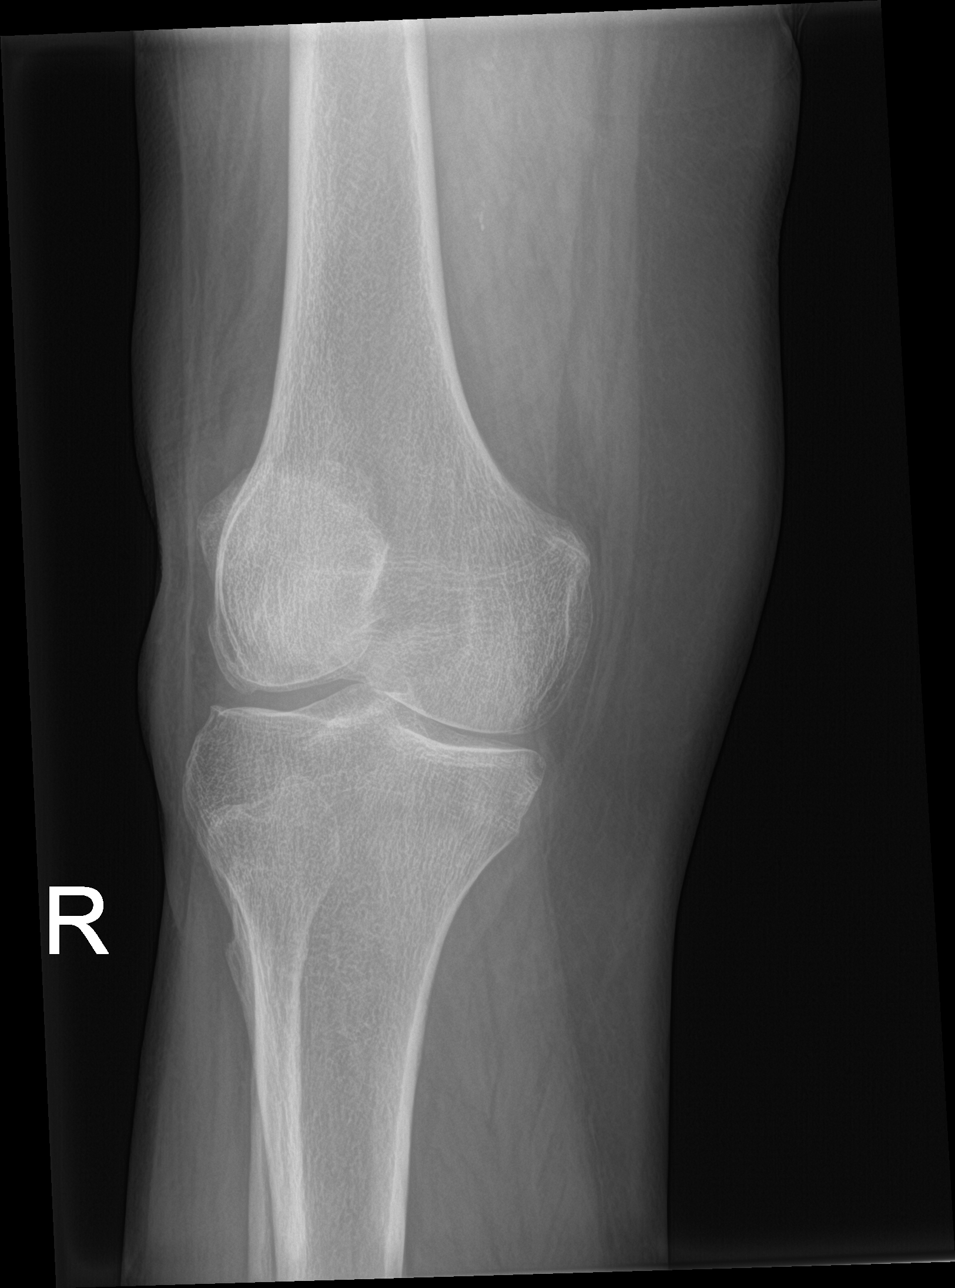

[knee obl (2 of 2)]
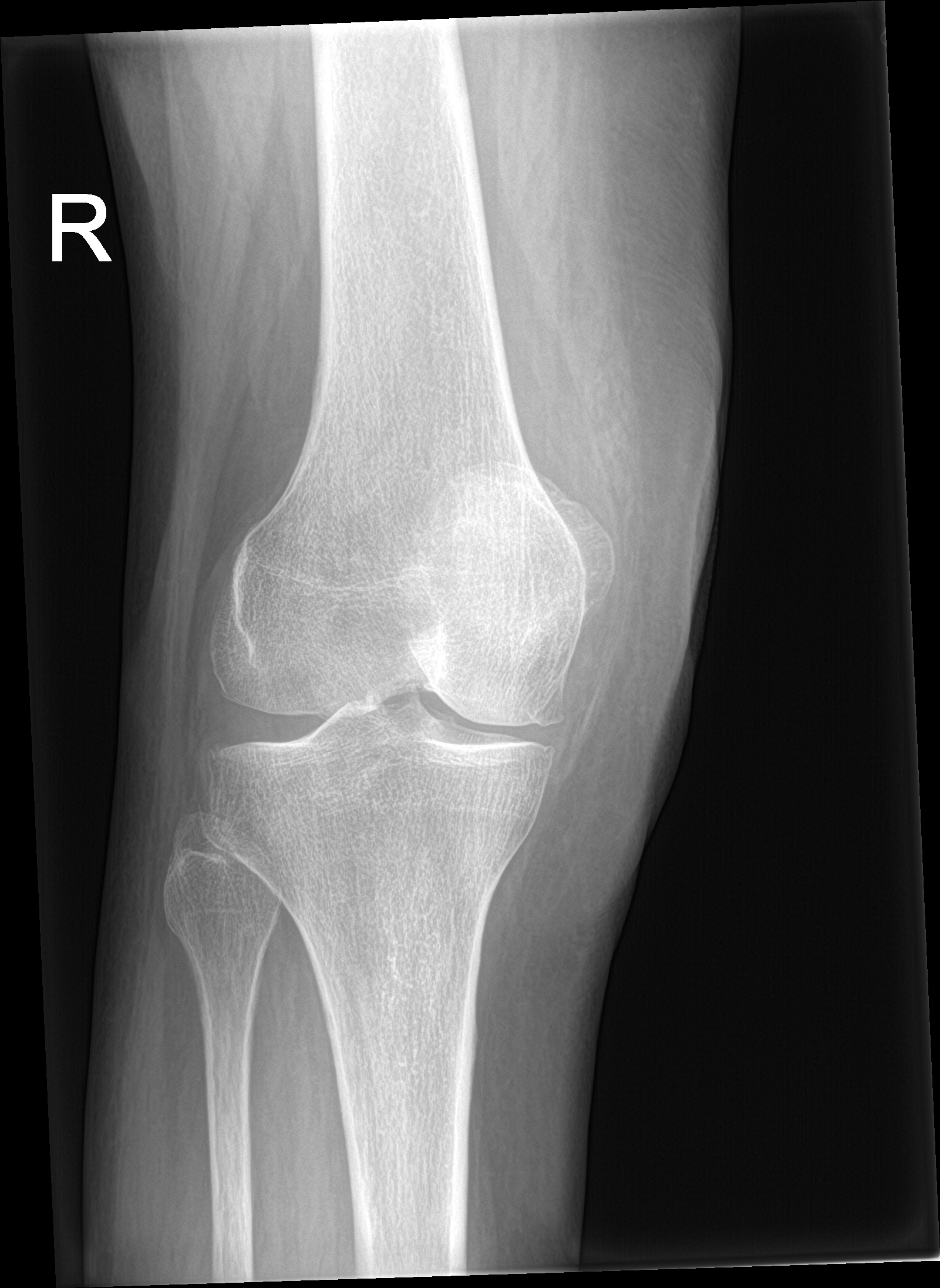

[4 of 4 positions shown; findings below may reference images not displayed]

FINDINGS: No evidence of fracture, dislocation, or joint effusion. No evidence
of arthropathy or other focal bone abnormality. Soft tissues are
unremarkable.
IMPRESSION: Negative.

## 2022-08-09 DIAGNOSIS — C50411 Malignant neoplasm of upper-outer quadrant of right female breast: Secondary | ICD-10-CM | POA: Diagnosis not present

## 2022-08-09 DIAGNOSIS — Z171 Estrogen receptor negative status [ER-]: Secondary | ICD-10-CM | POA: Diagnosis not present

## 2022-09-13 DIAGNOSIS — L570 Actinic keratosis: Secondary | ICD-10-CM | POA: Diagnosis not present

## 2022-09-13 DIAGNOSIS — Z129 Encounter for screening for malignant neoplasm, site unspecified: Secondary | ICD-10-CM | POA: Diagnosis not present

## 2022-09-13 DIAGNOSIS — D485 Neoplasm of uncertain behavior of skin: Secondary | ICD-10-CM | POA: Diagnosis not present

## 2022-09-13 DIAGNOSIS — W908XXS Exposure to other nonionizing radiation, sequela: Secondary | ICD-10-CM | POA: Diagnosis not present

## 2022-09-13 DIAGNOSIS — L578 Other skin changes due to chronic exposure to nonionizing radiation: Secondary | ICD-10-CM | POA: Diagnosis not present

## 2022-09-13 DIAGNOSIS — D044 Carcinoma in situ of skin of scalp and neck: Secondary | ICD-10-CM | POA: Diagnosis not present

## 2022-09-13 DIAGNOSIS — Z86008 Personal history of in-situ neoplasm of other site: Secondary | ICD-10-CM | POA: Diagnosis not present

## 2022-09-14 DIAGNOSIS — L851 Acquired keratosis [keratoderma] palmaris et plantaris: Secondary | ICD-10-CM | POA: Diagnosis not present

## 2022-09-14 DIAGNOSIS — B351 Tinea unguium: Secondary | ICD-10-CM | POA: Diagnosis not present

## 2022-09-14 DIAGNOSIS — I739 Peripheral vascular disease, unspecified: Secondary | ICD-10-CM | POA: Diagnosis not present

## 2022-11-05 DIAGNOSIS — D044 Carcinoma in situ of skin of scalp and neck: Secondary | ICD-10-CM | POA: Diagnosis not present

## 2022-12-14 DIAGNOSIS — I739 Peripheral vascular disease, unspecified: Secondary | ICD-10-CM | POA: Diagnosis not present

## 2022-12-14 DIAGNOSIS — B351 Tinea unguium: Secondary | ICD-10-CM | POA: Diagnosis not present

## 2022-12-14 DIAGNOSIS — L851 Acquired keratosis [keratoderma] palmaris et plantaris: Secondary | ICD-10-CM | POA: Diagnosis not present

## 2022-12-25 DIAGNOSIS — I10 Essential (primary) hypertension: Secondary | ICD-10-CM | POA: Diagnosis not present

## 2022-12-25 DIAGNOSIS — E782 Mixed hyperlipidemia: Secondary | ICD-10-CM | POA: Diagnosis not present

## 2022-12-25 DIAGNOSIS — E1165 Type 2 diabetes mellitus with hyperglycemia: Secondary | ICD-10-CM | POA: Diagnosis not present

## 2023-01-06 DIAGNOSIS — N3000 Acute cystitis without hematuria: Secondary | ICD-10-CM | POA: Diagnosis not present

## 2023-01-06 DIAGNOSIS — R300 Dysuria: Secondary | ICD-10-CM | POA: Diagnosis not present

## 2023-02-05 DIAGNOSIS — C50411 Malignant neoplasm of upper-outer quadrant of right female breast: Secondary | ICD-10-CM | POA: Diagnosis not present

## 2023-02-05 DIAGNOSIS — Z171 Estrogen receptor negative status [ER-]: Secondary | ICD-10-CM | POA: Diagnosis not present

## 2023-03-21 DIAGNOSIS — W908XXS Exposure to other nonionizing radiation, sequela: Secondary | ICD-10-CM | POA: Diagnosis not present

## 2023-03-21 DIAGNOSIS — Z86008 Personal history of in-situ neoplasm of other site: Secondary | ICD-10-CM | POA: Diagnosis not present

## 2023-03-21 DIAGNOSIS — L578 Other skin changes due to chronic exposure to nonionizing radiation: Secondary | ICD-10-CM | POA: Diagnosis not present

## 2023-03-21 DIAGNOSIS — Z129 Encounter for screening for malignant neoplasm, site unspecified: Secondary | ICD-10-CM | POA: Diagnosis not present

## 2023-03-21 DIAGNOSIS — L570 Actinic keratosis: Secondary | ICD-10-CM | POA: Diagnosis not present

## 2023-03-21 DIAGNOSIS — D239 Other benign neoplasm of skin, unspecified: Secondary | ICD-10-CM | POA: Diagnosis not present

## 2023-03-21 DIAGNOSIS — Z85828 Personal history of other malignant neoplasm of skin: Secondary | ICD-10-CM | POA: Diagnosis not present

## 2023-04-04 DIAGNOSIS — L851 Acquired keratosis [keratoderma] palmaris et plantaris: Secondary | ICD-10-CM | POA: Diagnosis not present

## 2023-04-04 DIAGNOSIS — B351 Tinea unguium: Secondary | ICD-10-CM | POA: Diagnosis not present

## 2023-04-04 DIAGNOSIS — I739 Peripheral vascular disease, unspecified: Secondary | ICD-10-CM | POA: Diagnosis not present

## 2023-05-21 DIAGNOSIS — N390 Urinary tract infection, site not specified: Secondary | ICD-10-CM | POA: Diagnosis not present

## 2023-06-10 DIAGNOSIS — Z1231 Encounter for screening mammogram for malignant neoplasm of breast: Secondary | ICD-10-CM | POA: Diagnosis not present

## 2023-06-10 DIAGNOSIS — Z171 Estrogen receptor negative status [ER-]: Secondary | ICD-10-CM | POA: Diagnosis not present

## 2023-06-14 DIAGNOSIS — C50411 Malignant neoplasm of upper-outer quadrant of right female breast: Secondary | ICD-10-CM | POA: Diagnosis not present

## 2023-06-14 DIAGNOSIS — Z171 Estrogen receptor negative status [ER-]: Secondary | ICD-10-CM | POA: Diagnosis not present

## 2023-07-04 DIAGNOSIS — Z0000 Encounter for general adult medical examination without abnormal findings: Secondary | ICD-10-CM | POA: Diagnosis not present

## 2023-07-04 DIAGNOSIS — E113293 Type 2 diabetes mellitus with mild nonproliferative diabetic retinopathy without macular edema, bilateral: Secondary | ICD-10-CM | POA: Diagnosis not present

## 2023-07-04 DIAGNOSIS — E782 Mixed hyperlipidemia: Secondary | ICD-10-CM | POA: Diagnosis not present

## 2023-07-04 DIAGNOSIS — Z23 Encounter for immunization: Secondary | ICD-10-CM | POA: Diagnosis not present

## 2023-07-04 DIAGNOSIS — R609 Edema, unspecified: Secondary | ICD-10-CM | POA: Diagnosis not present

## 2023-07-04 DIAGNOSIS — I10 Essential (primary) hypertension: Secondary | ICD-10-CM | POA: Diagnosis not present

## 2023-07-04 DIAGNOSIS — M8588 Other specified disorders of bone density and structure, other site: Secondary | ICD-10-CM | POA: Diagnosis not present

## 2023-07-05 DIAGNOSIS — L859 Epidermal thickening, unspecified: Secondary | ICD-10-CM | POA: Diagnosis not present

## 2023-07-05 DIAGNOSIS — B351 Tinea unguium: Secondary | ICD-10-CM | POA: Diagnosis not present

## 2023-07-05 DIAGNOSIS — I739 Peripheral vascular disease, unspecified: Secondary | ICD-10-CM | POA: Diagnosis not present

## 2023-07-24 ENCOUNTER — Encounter (HOSPITAL_BASED_OUTPATIENT_CLINIC_OR_DEPARTMENT_OTHER): Payer: Self-pay | Admitting: Emergency Medicine

## 2023-07-24 ENCOUNTER — Emergency Department (HOSPITAL_BASED_OUTPATIENT_CLINIC_OR_DEPARTMENT_OTHER)
Admission: EM | Admit: 2023-07-24 | Discharge: 2023-07-24 | Disposition: A | Discharge status: Home / Self Care | Attending: Emergency Medicine | Admitting: Emergency Medicine

## 2023-07-24 ENCOUNTER — Other Ambulatory Visit: Payer: Self-pay

## 2023-07-24 ENCOUNTER — Emergency Department (HOSPITAL_BASED_OUTPATIENT_CLINIC_OR_DEPARTMENT_OTHER)

## 2023-07-24 DIAGNOSIS — S8255XA Nondisplaced fracture of medial malleolus of left tibia, initial encounter for closed fracture: Secondary | ICD-10-CM | POA: Diagnosis not present

## 2023-07-24 DIAGNOSIS — W108XXA Fall (on) (from) other stairs and steps, initial encounter: Secondary | ICD-10-CM | POA: Insufficient documentation

## 2023-07-24 DIAGNOSIS — S82832A Other fracture of upper and lower end of left fibula, initial encounter for closed fracture: Secondary | ICD-10-CM | POA: Diagnosis not present

## 2023-07-24 DIAGNOSIS — W19XXXA Unspecified fall, initial encounter: Secondary | ICD-10-CM | POA: Diagnosis not present

## 2023-07-24 DIAGNOSIS — M25572 Pain in left ankle and joints of left foot: Secondary | ICD-10-CM | POA: Diagnosis present

## 2023-07-24 DIAGNOSIS — S82302A Unspecified fracture of lower end of left tibia, initial encounter for closed fracture: Secondary | ICD-10-CM | POA: Diagnosis not present

## 2023-07-24 DIAGNOSIS — S82435A Nondisplaced oblique fracture of shaft of left fibula, initial encounter for closed fracture: Secondary | ICD-10-CM | POA: Diagnosis not present

## 2023-07-24 DIAGNOSIS — S8252XA Displaced fracture of medial malleolus of left tibia, initial encounter for closed fracture: Secondary | ICD-10-CM | POA: Diagnosis not present

## 2023-07-24 DIAGNOSIS — I10 Essential (primary) hypertension: Secondary | ICD-10-CM | POA: Diagnosis not present

## 2023-07-24 DIAGNOSIS — S82842A Displaced bimalleolar fracture of left lower leg, initial encounter for closed fracture: Secondary | ICD-10-CM | POA: Diagnosis not present

## 2023-07-24 DIAGNOSIS — R609 Edema, unspecified: Secondary | ICD-10-CM | POA: Diagnosis not present

## 2023-07-24 MED ORDER — IBUPROFEN 400 MG PO TABS
600.0000 mg | ORAL_TABLET | Freq: Once | ORAL | Status: AC
  Administered 2023-07-24: 600 mg via ORAL
  Filled 2023-07-24: qty 1

## 2023-07-24 MED ORDER — OXYCODONE-ACETAMINOPHEN 5-325 MG PO TABS
1.0000 | ORAL_TABLET | Freq: Once | ORAL | Status: AC
  Administered 2023-07-24: 1 via ORAL
  Filled 2023-07-24: qty 1

## 2023-07-24 MED ORDER — OXYCODONE HCL 5 MG PO TABS: 5.0000 mg | ORAL_TABLET | Freq: Four times a day (QID) | ORAL | 0 refills | Status: AC | PRN

## 2023-07-24 MED ORDER — ACETAMINOPHEN 500 MG PO TABS
500.0000 mg | ORAL_TABLET | Freq: Once | ORAL | Status: AC
  Administered 2023-07-24: 500 mg via ORAL
  Filled 2023-07-24: qty 1

## 2023-07-24 NOTE — ED Triage Notes (Signed)
 Pt fell down about 4 steps today; she landed, sitting on LT ankle; c/o LT ankle pain; sts she was ambulatory afterward; does not take blood thinners

## 2023-07-24 NOTE — ED Provider Notes (Signed)
 [place] EMERGENCY DEPARTMENT AT [facility] Provider Note   CSN: [id number] Arrival date & time: 07/24/23  1602     History  Chief Complaint  Patient presents with   Fall    Kathryn  I Dominguez is a 89 y.o. female with history of hypertension, presents with concern for left ankle pain after falling down approximately 4 steps earlier today.  States she tripped and fell onto her left ankle.  Denies any dizziness, chest pain, shortness of breath before or after the fall.  Denies hitting her head or any loss of consciousness.  Denies pain anywhere else in her body.  Denies any numbness or tingling in the left foot.   Fall       Home Medications Prior to Admission medications   Medication Sig Start Date End Date Taking? Authorizing Provider  oxyCODONE  (ROXICODONE ) 5 MG immediate release tablet Take 1 tablet (5 mg total) by mouth every 6 (six) hours as needed for up to 5 days for severe pain (pain score 7-10). 07/24/23 07/29/23 Yes Rexie Catena, PA-C  calcium carbonate (OS-CAL) 600 MG TABS tablet Take 600 mg by mouth 2 (two) times daily with a meal.    [provider]  cholecalciferol (VITAMIN D) 1000 UNITS tablet Take 2,000 Units by mouth daily.    [provider]  metoprolol succinate (TOPROL-XL) 100 MG 24 hr tablet Take 100 mg by mouth daily. Take with or immediately following a meal.    [provider]  Omega-3 Fatty Acids (FISH OIL) 1200 MG CAPS Take 1,290 mg by mouth.    [provider]  Red Yeast Rice 600 MG CAPS Take by mouth.    [provider]  triamterene-hydrochlorothiazide (DYAZIDE) 37.5-25 MG per capsule Take 1 capsule by mouth daily.    [provider]      Allergies    Lovastatin    Review of Systems   Review of Systems  Neurological:  Negative for numbness.    Physical Exam Updated Vital Signs BP (!) 180/66   Pulse 61   Temp 98 F (36.7 C)   Resp 16   Wt 74.4 kg   SpO2 98%   BMI 27.29  kg/m  Physical Exam Vitals and nursing note reviewed.  Constitutional:      General: She is not in acute distress.    Appearance: She is well-developed.  HENT:     Head: Normocephalic and atraumatic.  Eyes:     Conjunctiva/sclera: Conjunctivae normal.  Cardiovascular:     Rate and Rhythm: Normal rate and regular rhythm.     Heart sounds: No murmur heard.    Comments: 2+ dorsalis pedis pulse bilaterally Normal coloration of the feet bilaterally Pulmonary:     Effort: Pulmonary effort is normal. No respiratory distress.     Breath sounds: Normal breath sounds.  Abdominal:     Palpations: Abdomen is soft.     Tenderness: There is no abdominal tenderness.  Musculoskeletal:        General: No swelling.     Cervical back: Neck supple.     Comments: No tenderness to palpation of the cervical, thoracic, or lumbar spine.  No tenderness diffusely at the upper extremities bilaterally.  No tenderness diffusely of the right lower extremity.  Left lower extremity: General Edema and ecchymoses surrounding the left ankle.  No open wounds.  Palpation Tender to palpation over the distal aspect of the left shin and tender over the medial malleolus.  Nontender over the  lateral malleolus.  Non tender over the knee Nontender along the proximal tibia Non-tender along the achilles tendon, navicular, or calcaneus Nontender along the first through fifth metatarsals and phalanges  Calf and foot are soft to palpation  ROM Limited ankle flexion, extension, inversion and eversion secondary to pain and swelling  Special tests Negative Thompson's (squeeze) test  Sensation: Sensation intact throughout the lower extremity   Skin:    General: Skin is warm and dry.     Capillary Refill: Capillary refill takes less than 2 seconds.  Neurological:     Mental Status: She is alert.  Psychiatric:        Mood and Affect: Mood normal.     ED Results / Procedures / Treatments   Labs (all labs  ordered are listed, but only abnormal results are displayed) Labs Reviewed - No data to display  EKG None  Radiology DG Ankle Complete Left Result Date: 07/24/2023 CLINICAL DATA:  Status post fall ankle pain EXAM: LEFT ANKLE COMPLETE - 3+ VIEW COMPARISON:  None Available. FINDINGS: Nondisplaced oblique spiral fracture of the distal fibula above the tibiofibular syndesmosis and the medial malleolus. Consistent with a bimalleolar fracture. Posterior malleolus appears intact. The medial malleolar fracture seems to extend slightly anterior along the anterior tibial plafond as seen on the lateral view IMPRESSION: Bimalleolar fracture. Electronically Signed   By: Fredrich Jefferson M.D.   On: 07/24/2023 16:53    Procedures Procedures    Medications Ordered in ED Medications  oxyCODONE -acetaminophen  (PERCOCET/ROXICET) 5-325 MG per tablet 1 tablet (1 tablet Oral Given 07/24/23 1704)  acetaminophen  (TYLENOL ) tablet 500 mg (500 mg Oral Given 07/24/23 1704)  ibuprofen  (ADVIL ) tablet 600 mg (600 mg Oral Given 07/24/23 1755)    ED Course/ Medical Decision Making/ A&P                                 Medical Decision Making Amount and/or Complexity of Data Reviewed Radiology: ordered.  Risk OTC drugs. Prescription drug management.     Differential diagnosis includes but is not limited to fracture, sprain, injury, vascular injury, compartment syndrome  ED Course:  Upon initial evaluation, patient is well-appearing, stable vitals aside from elevated blood pressure 180/66.  She does have ecchymoses and edema surrounding the left ankle diffusely.  Reports pain in her left ankle.  Tender over the medial malleolus and lateral aspect of the shin.  Neurovascular intact in the left foot.  Calf and foot are soft, no concern for compartment syndrome at this time.  No tenderness elsewhere of the upper extremities bilaterally or right lower extremity.  No spinal tenderness palpation.  She did not hit her head,  no loss consciousness, no indication for CT head imaging.   Imaging Studies ordered: I ordered imaging studies including x-ray left ankle I independently visualized the imaging with scope of interpretation limited to determining acute life threatening conditions related to emergency care.  X-ray shows nondisplaced oblique spiral fracture of the distal left fibula.  Also fracture of the medial malleolus.  Consistent with bimalleolar fracture. I agree with the radiologist interpretation   Consultations Obtained: I requested consultation with the orthopedic Dr. Yvonne Hering,  and discussed lab and imaging findings as well as pertinent plan - they recommend: splint, non weight bearing.  Follow-up with his PA, Nicholas Bari in the next week.  Medications Given: Percocet, Tylenol , and ibuprofen .  Patient placed in posterior and sugar-tong splint of the  left leg.  She remains neurovascularly intact after splint placement.  Pain well-controlled with medications given.  Discussed that she needs to be nonweightbearing at home.  She feels uncomfortable using crutches, but states she has a wheelchair she can use at home.  She declines any wheelchair or crutches here today.  She has a family member at bedside who will drive her home and get her set up with her wheelchair.  Stable and appropriate for discharge home  Impression: Left fibular fracture Left medial malleolus fracture  Disposition:  The patient was discharged home with instructions to remain nonweightbearing on the left leg.  Use her wheelchair at home to help her ambulate.  Keep splint clean and dry.  Follow-up with Nicholas Bari, PA-C with orthopedics within the next week, her contact information was provided.  She understands she needs to call to schedule this appointment.  Tylenol  and ibuprofen  as needed for pain.  Oxycodone  for breakthrough pain. Return precautions given.    This chart was dictated using voice recognition software,  Dragon. Despite the best efforts of this provider to proofread and correct errors, errors may still occur which can change documentation meaning.          Final Clinical Impression(s) / ED Diagnoses Final diagnoses:  Other closed fracture of distal end of left fibula, initial encounter  Closed nondisplaced fracture of medial malleolus of left tibia, initial encounter    Rx / DC Orders ED Discharge Orders          Ordered    oxyCODONE  (ROXICODONE ) 5 MG immediate release tablet  Every 6 hours PRN        07/24/23 1825              Rexie Catena, PA-C 07/24/23 1852    Tegeler, Marine Sia, MD 07/24/23 2049

## 2023-07-24 NOTE — Discharge Instructions (Addendum)
 Your x-rays today shows that you have a fracture of your leg bones.  You have 1 fracture at the end of your tibia, and 1 fracture of your fibula.  You have been placed into a splint to hold the fracture in place.  Please keep this splint clean and dry.  Wrap the splint with a garbage bag or Saran wrap to keep it dry while showering or bathing.  Do not take off the splint.   You may not walk on your left leg.  You must remain nonweightbearing.  Please use your wheelchair at home to help you get around.  You may take up to 1000mg  of tylenol  every 6 hours as needed for pain. Do not take more then 4g per day. This is an over the counter medication  You may use up to 600mg  ibuprofen  every 6 hours as needed for pain.  Do not exceed 2.4g of ibuprofen  per day. This is an over the counter medication  You may alternate these medications for complete coverage. See instructions below:  Take 600mg  ibuprofen , then 3 hours later take 1000mg  tylenol , then 3 hours later 600mg  ibuprofen , then 3 hours later 1000mg  tylenol , so on and so forth for pain control.    You have been prescribed Oxycodone -this is a narcotic/controlled substance medication that has potential addicting qualities.  You may take 1 tablet every 6 hours as needed for severe pain not controlled with tylenol  and ibuprofen .  Do not drive or operate heavy machinery when taking this medicine as it can be sedating. Do not drink alcohol or take other sedating medications when taking this medicine for safety reasons.  Keep this out of reach of small children.  You were given a dose here at 5:15 PM.   Call the orthopedics office listed below as soon as possible to schedule a follow-up appointment with the PA [facility]  Return to the ER for any uncontrolled pain, numbness or tingling, discoloration, any other new or concerning symptoms.

## 2023-07-29 DIAGNOSIS — S82892A Other fracture of left lower leg, initial encounter for closed fracture: Secondary | ICD-10-CM | POA: Diagnosis not present

## 2023-08-01 DIAGNOSIS — X58XXXA Exposure to other specified factors, initial encounter: Secondary | ICD-10-CM | POA: Diagnosis not present

## 2023-08-01 DIAGNOSIS — S82852A Displaced trimalleolar fracture of left lower leg, initial encounter for closed fracture: Secondary | ICD-10-CM | POA: Diagnosis not present

## 2023-08-01 DIAGNOSIS — S93432A Sprain of tibiofibular ligament of left ankle, initial encounter: Secondary | ICD-10-CM | POA: Diagnosis not present

## 2023-08-01 DIAGNOSIS — G8918 Other acute postprocedural pain: Secondary | ICD-10-CM | POA: Diagnosis not present

## 2023-08-01 DIAGNOSIS — Y999 Unspecified external cause status: Secondary | ICD-10-CM | POA: Diagnosis not present

## 2023-08-12 DIAGNOSIS — S82852D Displaced trimalleolar fracture of left lower leg, subsequent encounter for closed fracture with routine healing: Secondary | ICD-10-CM | POA: Diagnosis not present

## 2023-09-02 DIAGNOSIS — S82852D Displaced trimalleolar fracture of left lower leg, subsequent encounter for closed fracture with routine healing: Secondary | ICD-10-CM | POA: Diagnosis not present

## 2023-09-16 DIAGNOSIS — M25572 Pain in left ankle and joints of left foot: Secondary | ICD-10-CM | POA: Diagnosis not present

## 2023-09-16 DIAGNOSIS — Z4789 Encounter for other orthopedic aftercare: Secondary | ICD-10-CM | POA: Diagnosis not present

## 2023-09-20 DIAGNOSIS — Z4789 Encounter for other orthopedic aftercare: Secondary | ICD-10-CM | POA: Diagnosis not present

## 2023-09-20 DIAGNOSIS — M25572 Pain in left ankle and joints of left foot: Secondary | ICD-10-CM | POA: Diagnosis not present

## 2023-09-24 DIAGNOSIS — M25572 Pain in left ankle and joints of left foot: Secondary | ICD-10-CM | POA: Diagnosis not present

## 2023-09-24 DIAGNOSIS — Z4789 Encounter for other orthopedic aftercare: Secondary | ICD-10-CM | POA: Diagnosis not present

## 2023-09-26 DIAGNOSIS — Z4789 Encounter for other orthopedic aftercare: Secondary | ICD-10-CM | POA: Diagnosis not present

## 2023-09-26 DIAGNOSIS — M25572 Pain in left ankle and joints of left foot: Secondary | ICD-10-CM | POA: Diagnosis not present

## 2023-09-30 DIAGNOSIS — S82852D Displaced trimalleolar fracture of left lower leg, subsequent encounter for closed fracture with routine healing: Secondary | ICD-10-CM | POA: Diagnosis not present

## 2023-10-01 DIAGNOSIS — Z4789 Encounter for other orthopedic aftercare: Secondary | ICD-10-CM | POA: Diagnosis not present

## 2023-10-01 DIAGNOSIS — M25572 Pain in left ankle and joints of left foot: Secondary | ICD-10-CM | POA: Diagnosis not present

## 2023-10-03 DIAGNOSIS — I739 Peripheral vascular disease, unspecified: Secondary | ICD-10-CM | POA: Diagnosis not present

## 2023-10-03 DIAGNOSIS — L851 Acquired keratosis [keratoderma] palmaris et plantaris: Secondary | ICD-10-CM | POA: Diagnosis not present

## 2023-10-03 DIAGNOSIS — M25572 Pain in left ankle and joints of left foot: Secondary | ICD-10-CM | POA: Diagnosis not present

## 2023-10-03 DIAGNOSIS — B351 Tinea unguium: Secondary | ICD-10-CM | POA: Diagnosis not present

## 2023-10-03 DIAGNOSIS — Z4789 Encounter for other orthopedic aftercare: Secondary | ICD-10-CM | POA: Diagnosis not present

## 2023-10-08 DIAGNOSIS — Z4789 Encounter for other orthopedic aftercare: Secondary | ICD-10-CM | POA: Diagnosis not present

## 2023-10-08 DIAGNOSIS — M25572 Pain in left ankle and joints of left foot: Secondary | ICD-10-CM | POA: Diagnosis not present

## 2023-10-10 DIAGNOSIS — M25572 Pain in left ankle and joints of left foot: Secondary | ICD-10-CM | POA: Diagnosis not present

## 2023-10-10 DIAGNOSIS — Z4789 Encounter for other orthopedic aftercare: Secondary | ICD-10-CM | POA: Diagnosis not present

## 2023-10-17 DIAGNOSIS — M25572 Pain in left ankle and joints of left foot: Secondary | ICD-10-CM | POA: Diagnosis not present

## 2023-10-17 DIAGNOSIS — Z4789 Encounter for other orthopedic aftercare: Secondary | ICD-10-CM | POA: Diagnosis not present

## 2023-10-22 DIAGNOSIS — Z4789 Encounter for other orthopedic aftercare: Secondary | ICD-10-CM | POA: Diagnosis not present

## 2023-10-22 DIAGNOSIS — M25572 Pain in left ankle and joints of left foot: Secondary | ICD-10-CM | POA: Diagnosis not present

## 2023-10-24 DIAGNOSIS — Z4789 Encounter for other orthopedic aftercare: Secondary | ICD-10-CM | POA: Diagnosis not present

## 2023-10-24 DIAGNOSIS — M25572 Pain in left ankle and joints of left foot: Secondary | ICD-10-CM | POA: Diagnosis not present

## 2023-10-31 DIAGNOSIS — M25572 Pain in left ankle and joints of left foot: Secondary | ICD-10-CM | POA: Diagnosis not present

## 2023-10-31 DIAGNOSIS — Z4789 Encounter for other orthopedic aftercare: Secondary | ICD-10-CM | POA: Diagnosis not present

## 2023-11-02 DIAGNOSIS — N3000 Acute cystitis without hematuria: Secondary | ICD-10-CM | POA: Diagnosis not present

## 2023-11-02 DIAGNOSIS — R300 Dysuria: Secondary | ICD-10-CM | POA: Diagnosis not present

## 2023-11-05 DIAGNOSIS — Z4789 Encounter for other orthopedic aftercare: Secondary | ICD-10-CM | POA: Diagnosis not present

## 2023-11-05 DIAGNOSIS — M25572 Pain in left ankle and joints of left foot: Secondary | ICD-10-CM | POA: Diagnosis not present

## 2023-11-07 DIAGNOSIS — Z4789 Encounter for other orthopedic aftercare: Secondary | ICD-10-CM | POA: Diagnosis not present

## 2023-11-07 DIAGNOSIS — M25572 Pain in left ankle and joints of left foot: Secondary | ICD-10-CM | POA: Diagnosis not present

## 2023-11-11 DIAGNOSIS — S82852D Displaced trimalleolar fracture of left lower leg, subsequent encounter for closed fracture with routine healing: Secondary | ICD-10-CM | POA: Diagnosis not present

## 2023-12-06 DIAGNOSIS — S52254A Nondisplaced comminuted fracture of shaft of ulna, right arm, initial encounter for closed fracture: Secondary | ICD-10-CM | POA: Diagnosis not present

## 2023-12-06 DIAGNOSIS — S82852D Displaced trimalleolar fracture of left lower leg, subsequent encounter for closed fracture with routine healing: Secondary | ICD-10-CM | POA: Diagnosis not present

## 2023-12-06 DIAGNOSIS — M25511 Pain in right shoulder: Secondary | ICD-10-CM | POA: Diagnosis not present

## 2023-12-16 DIAGNOSIS — M25511 Pain in right shoulder: Secondary | ICD-10-CM | POA: Diagnosis not present

## 2023-12-16 DIAGNOSIS — S52254D Nondisplaced comminuted fracture of shaft of ulna, right arm, subsequent encounter for closed fracture with routine healing: Secondary | ICD-10-CM | POA: Diagnosis not present

## 2023-12-31 DIAGNOSIS — E782 Mixed hyperlipidemia: Secondary | ICD-10-CM | POA: Diagnosis not present

## 2023-12-31 DIAGNOSIS — E1140 Type 2 diabetes mellitus with diabetic neuropathy, unspecified: Secondary | ICD-10-CM | POA: Diagnosis not present

## 2023-12-31 DIAGNOSIS — I10 Essential (primary) hypertension: Secondary | ICD-10-CM | POA: Diagnosis not present

## 2023-12-31 DIAGNOSIS — E1165 Type 2 diabetes mellitus with hyperglycemia: Secondary | ICD-10-CM | POA: Diagnosis not present

## 2024-01-01 DIAGNOSIS — S52254D Nondisplaced comminuted fracture of shaft of ulna, right arm, subsequent encounter for closed fracture with routine healing: Secondary | ICD-10-CM | POA: Diagnosis not present

## 2024-01-13 DIAGNOSIS — R296 Repeated falls: Secondary | ICD-10-CM | POA: Diagnosis not present

## 2024-01-13 DIAGNOSIS — E782 Mixed hyperlipidemia: Secondary | ICD-10-CM | POA: Diagnosis not present

## 2024-01-13 DIAGNOSIS — I10 Essential (primary) hypertension: Secondary | ICD-10-CM | POA: Diagnosis not present

## 2024-01-13 DIAGNOSIS — E113293 Type 2 diabetes mellitus with mild nonproliferative diabetic retinopathy without macular edema, bilateral: Secondary | ICD-10-CM | POA: Diagnosis not present

## 2024-01-13 DIAGNOSIS — Z23 Encounter for immunization: Secondary | ICD-10-CM | POA: Diagnosis not present

## 2024-01-13 DIAGNOSIS — E1165 Type 2 diabetes mellitus with hyperglycemia: Secondary | ICD-10-CM | POA: Diagnosis not present
